# Patient Record
Sex: Male | Born: 1935 | Race: White | Hispanic: No | Marital: Married | Smoking: Never smoker
Health system: Southern US, Community
[De-identification: ages and names within clinical notes are randomized; demographics above are authoritative.]

## PROBLEM LIST (undated history)

## (undated) DIAGNOSIS — K5730 Diverticulosis of large intestine without perforation or abscess without bleeding: Secondary | ICD-10-CM

## (undated) DIAGNOSIS — G709 Myoneural disorder, unspecified: Secondary | ICD-10-CM

## (undated) DIAGNOSIS — E785 Hyperlipidemia, unspecified: Secondary | ICD-10-CM

## (undated) DIAGNOSIS — I712 Thoracic aortic aneurysm, without rupture, unspecified: Secondary | ICD-10-CM

## (undated) DIAGNOSIS — E538 Deficiency of other specified B group vitamins: Secondary | ICD-10-CM

## (undated) DIAGNOSIS — I10 Essential (primary) hypertension: Secondary | ICD-10-CM

## (undated) DIAGNOSIS — E119 Type 2 diabetes mellitus without complications: Secondary | ICD-10-CM

## (undated) DIAGNOSIS — Z9289 Personal history of other medical treatment: Secondary | ICD-10-CM

## (undated) DIAGNOSIS — M545 Other chronic pain: Secondary | ICD-10-CM

## (undated) DIAGNOSIS — C61 Malignant neoplasm of prostate: Secondary | ICD-10-CM

## (undated) DIAGNOSIS — Z9221 Personal history of antineoplastic chemotherapy: Secondary | ICD-10-CM

## (undated) DIAGNOSIS — M109 Gout, unspecified: Secondary | ICD-10-CM

## (undated) DIAGNOSIS — G8929 Other chronic pain: Secondary | ICD-10-CM

## (undated) DIAGNOSIS — Z923 Personal history of irradiation: Secondary | ICD-10-CM

## (undated) DIAGNOSIS — I2510 Atherosclerotic heart disease of native coronary artery without angina pectoris: Secondary | ICD-10-CM

## (undated) DIAGNOSIS — K219 Gastro-esophageal reflux disease without esophagitis: Secondary | ICD-10-CM

## (undated) DIAGNOSIS — G629 Polyneuropathy, unspecified: Secondary | ICD-10-CM

## (undated) HISTORY — DX: Diverticulosis of large intestine without perforation or abscess without bleeding: K57.30

## (undated) HISTORY — DX: Gout, unspecified: M10.9

## (undated) HISTORY — PX: APPENDECTOMY: SHX54

## (undated) HISTORY — PX: PROSTATE SURGERY: SHX751

## (undated) HISTORY — DX: Personal history of irradiation: Z92.3

## (undated) HISTORY — DX: Myoneural disorder, unspecified: G70.9

## (undated) HISTORY — PX: OTHER SURGICAL HISTORY: SHX169

## (undated) HISTORY — DX: Other chronic pain: G89.29

## (undated) HISTORY — DX: Personal history of antineoplastic chemotherapy: Z92.21

## (undated) HISTORY — DX: Atherosclerotic heart disease of native coronary artery without angina pectoris: I25.10

## (undated) HISTORY — DX: Type 2 diabetes mellitus without complications: E11.9

## (undated) HISTORY — PX: HERNIA REPAIR: SHX51

## (undated) HISTORY — PX: CORONARY ANGIOPLASTY WITH STENT PLACEMENT: SHX49

## (undated) HISTORY — PX: SPINE SURGERY: SHX786

## (undated) HISTORY — DX: Essential (primary) hypertension: I10

## (undated) HISTORY — DX: Thoracic aortic aneurysm, without rupture, unspecified: I71.20

## (undated) HISTORY — DX: Other chronic pain: M54.50

## (undated) HISTORY — DX: Deficiency of other specified B group vitamins: E53.8

## (undated) HISTORY — DX: Malignant neoplasm of prostate: C61

## (undated) HISTORY — DX: Thoracic aortic aneurysm, without rupture: I71.2

---
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
## 2002-09-29 HISTORY — PX: FOOT SURGERY: SHX648

## 2005-09-29 HISTORY — PX: OTHER SURGICAL HISTORY: SHX169

## 2008-11-27 HISTORY — PX: PROSTATE SURGERY: SHX751

## 2009-11-01 DIAGNOSIS — Z9289 Personal history of other medical treatment: Secondary | ICD-10-CM

## 2009-11-01 HISTORY — DX: Personal history of other medical treatment: Z92.89

## 2012-02-13 ENCOUNTER — Emergency Department (HOSPITAL_BASED_OUTPATIENT_CLINIC_OR_DEPARTMENT_OTHER): Payer: Medicare Other

## 2012-02-13 ENCOUNTER — Emergency Department (HOSPITAL_BASED_OUTPATIENT_CLINIC_OR_DEPARTMENT_OTHER)
Admission: EM | Admit: 2012-02-13 | Discharge: 2012-02-14 | Disposition: A | Discharge status: Home / Self Care | Payer: Medicare Other | Attending: Emergency Medicine | Admitting: Emergency Medicine

## 2012-02-13 ENCOUNTER — Encounter (HOSPITAL_BASED_OUTPATIENT_CLINIC_OR_DEPARTMENT_OTHER): Payer: Self-pay

## 2012-02-13 DIAGNOSIS — R509 Fever, unspecified: Secondary | ICD-10-CM | POA: Insufficient documentation

## 2012-02-13 DIAGNOSIS — E785 Hyperlipidemia, unspecified: Secondary | ICD-10-CM | POA: Insufficient documentation

## 2012-02-13 DIAGNOSIS — K219 Gastro-esophageal reflux disease without esophagitis: Secondary | ICD-10-CM | POA: Insufficient documentation

## 2012-02-13 DIAGNOSIS — Z9889 Other specified postprocedural states: Secondary | ICD-10-CM | POA: Insufficient documentation

## 2012-02-13 DIAGNOSIS — A419 Sepsis, unspecified organism: Secondary | ICD-10-CM

## 2012-02-13 DIAGNOSIS — R300 Dysuria: Secondary | ICD-10-CM | POA: Insufficient documentation

## 2012-02-13 DIAGNOSIS — R000 Tachycardia, unspecified: Secondary | ICD-10-CM | POA: Insufficient documentation

## 2012-02-13 DIAGNOSIS — Z8639 Personal history of other endocrine, nutritional and metabolic disease: Secondary | ICD-10-CM | POA: Insufficient documentation

## 2012-02-13 DIAGNOSIS — Z862 Personal history of diseases of the blood and blood-forming organs and certain disorders involving the immune mechanism: Secondary | ICD-10-CM | POA: Insufficient documentation

## 2012-02-13 DIAGNOSIS — N390 Urinary tract infection, site not specified: Secondary | ICD-10-CM | POA: Insufficient documentation

## 2012-02-13 DIAGNOSIS — Z79899 Other long term (current) drug therapy: Secondary | ICD-10-CM | POA: Insufficient documentation

## 2012-02-13 DIAGNOSIS — R109 Unspecified abdominal pain: Secondary | ICD-10-CM | POA: Insufficient documentation

## 2012-02-13 HISTORY — DX: Gastro-esophageal reflux disease without esophagitis: K21.9

## 2012-02-13 HISTORY — DX: Polyneuropathy, unspecified: G62.9

## 2012-02-13 HISTORY — DX: Hyperlipidemia, unspecified: E78.5

## 2012-02-13 LAB — DIFFERENTIAL
Basophils Relative: 0 % (ref 0–1)
Eosinophils Relative: 4 % (ref 0–5)
Lymphocytes Relative: 4 % — ABNORMAL LOW (ref 12–46)
Monocytes Relative: 8 % (ref 3–12)
Neutro Abs: 8.8 10*3/uL — ABNORMAL HIGH (ref 1.7–7.7)
Neutrophils Relative %: 84 % — ABNORMAL HIGH (ref 43–77)

## 2012-02-13 LAB — COMPREHENSIVE METABOLIC PANEL
Alkaline Phosphatase: 139 U/L — ABNORMAL HIGH (ref 39–117)
BUN: 17 mg/dL (ref 6–23)
CO2: 23 mEq/L (ref 19–32)
Chloride: 100 mEq/L (ref 96–112)
Creatinine, Ser: 1.5 mg/dL — ABNORMAL HIGH (ref 0.50–1.35)
GFR calc non Af Amer: 44 mL/min — ABNORMAL LOW (ref >90)
Glucose, Bld: 204 mg/dL — ABNORMAL HIGH (ref 70–99)
Potassium: 4.4 mEq/L (ref 3.5–5.1)
Total Bilirubin: 0.6 mg/dL (ref 0.3–1.2)

## 2012-02-13 LAB — CBC
HCT: 40.1 % (ref 39.0–52.0)
Hemoglobin: 14.4 g/dL (ref 13.0–17.0)
MCHC: 35.9 g/dL (ref 30.0–36.0)
RBC: 4.61 MIL/uL (ref 4.22–5.81)
WBC: 10.4 10*3/uL (ref 4.0–10.5)

## 2012-02-13 LAB — URINE MICROSCOPIC-ADD ON

## 2012-02-13 LAB — URINALYSIS, ROUTINE W REFLEX MICROSCOPIC
Glucose, UA: NEGATIVE mg/dL
Ketones, ur: 15 mg/dL — AB
Specific Gravity, Urine: 1.025 (ref 1.005–1.030)
pH: 5.5 (ref 5.0–8.0)

## 2012-02-13 LAB — LACTIC ACID, PLASMA: Lactic Acid, Venous: 2.7 mmol/L — ABNORMAL HIGH (ref 0.5–2.2)

## 2012-02-13 MED ORDER — PIPERACILLIN-TAZOBACTAM 3.375 G IVPB
3.3750 g | Freq: Once | INTRAVENOUS | Status: AC
  Administered 2012-02-13: 3.375 g via INTRAVENOUS
  Filled 2012-02-13: qty 50

## 2012-02-13 MED ORDER — VANCOMYCIN HCL IN DEXTROSE 1-5 GM/200ML-% IV SOLN
1000.0000 mg | Freq: Once | INTRAVENOUS | Status: AC
  Administered 2012-02-13: 1000 mg via INTRAVENOUS
  Filled 2012-02-13: qty 200

## 2012-02-13 MED ORDER — ACETAMINOPHEN 325 MG PO TABS
650.0000 mg | ORAL_TABLET | Freq: Once | ORAL | Status: AC
  Administered 2012-02-13: 650 mg via ORAL
  Filled 2012-02-13: qty 2

## 2012-02-13 MED ORDER — SODIUM CHLORIDE 0.9 % IV SOLN
Freq: Once | INTRAVENOUS | Status: AC
  Administered 2012-02-13: 18:00:00 via INTRAVENOUS

## 2012-02-13 NOTE — ED Notes (Signed)
PA at bedside.

## 2012-02-13 NOTE — ED Notes (Signed)
Pt given urinal and asked for specimen.

## 2012-02-13 NOTE — ED Provider Notes (Signed)
History     CSN: [id number]  Arrival date & time 02/13/12  1608   First MD Initiated Contact with Patient 02/13/12 1706      Chief Complaint  Patient presents with  . Urinary Tract Infection    (Consider location/radiation/quality/duration/timing/severity/associated sxs/prior treatment) Patient is a 78 y.o. male presenting with fever and abdominal pain. The history is provided by the patient. No language interpreter was used.  Fever Primary symptoms of the febrile illness include fever, abdominal pain and dysuria. The current episode started today. This is a new problem. The problem has been gradually worsening.  The dysuria began today. The discomfort is mild. The dysuria is associated with hematuria.  The onset of the illness is associated with recent antibiotic use (recent uti). Risk factors: prostate. Abdominal Pain The primary symptoms of the illness include abdominal pain, fever and dysuria.  The dysuria is associated with hematuria.  Associated with: decreased appetite. Additional symptoms associated with the illness include hematuria.  Pt complains of feeling bad today.   Pt diagnosed with a uti last Sunday and started on macrobid.   Pt complains of a fever, pain.  Feeling weak.   Pt took ultram and it made him feel worse.    Past Medical History  Diagnosis Date  . Neuropathy   . Gout   . GERD (gastroesophageal reflux disease)   . Hyperlipemia     Past Surgical History  Procedure Date  . Prostate surgery   . Coronary angioplasty with stent placement     No family history on file.  History  Substance Use Topics  . Smoking status: Never Smoker   . Smokeless tobacco: Not on file  . Alcohol Use: No      Review of Systems  Constitutional: Positive for fever.  Gastrointestinal: Positive for abdominal pain.  Genitourinary: Positive for dysuria and hematuria.  All other systems reviewed and are negative.    Allergies  Review of patient's allergies indicates  no known allergies.  Home Medications   Current Outpatient Rx  Name Route Sig Dispense Refill  . ALLOPURINOL 300 MG PO TABS Oral Take 150 mg by mouth daily.    . OMEGA-3 FATTY ACIDS 1000 MG PO CAPS Oral Take 2 g by mouth daily.    Marland Kitchen GABAPENTIN 600 MG PO TABS Oral Take 1,200 mg by mouth 3 (three) times daily.    . ACIDOPHILUS PO Oral Take 175 mg by mouth 2 (two) times daily. Takes with antibiotic    . NITROFURANTOIN MONOHYD MACRO 100 MG PO CAPS Oral Take 100 mg by mouth 2 (two) times daily.    Marland Kitchen OMEPRAZOLE 20 MG PO CPDR Oral Take 20 mg by mouth daily.    Marland Kitchen PRAVASTATIN SODIUM 40 MG PO TABS Oral Take 40 mg by mouth daily.    . TRAMADOL HCL 50 MG PO TABS Oral Take 50 mg by mouth every 6 (six) hours as needed. For pain      BP 135/60  Pulse 122  Temp(Src) 98.6 F (37 C) (Oral)  Resp 20  Ht 6\' 1"  (1.854 m)  Wt 210 lb (95.255 kg)  BMI 27.71 kg/m2  SpO2 97%  Physical Exam  Nursing note and vitals reviewed. Constitutional: He is oriented to person, place, and time. He appears well-developed and well-nourished.       Temp by me 103  HENT:  Head: Normocephalic and atraumatic.  Nose: Nose normal.  Mouth/Throat: Oropharynx is clear and moist.  Neck: Normal range of motion. Neck  supple.  Cardiovascular: Normal heart sounds.        tachycardia  Pulmonary/Chest: Effort normal and breath sounds normal.  Abdominal: Soft. Bowel sounds are normal.  Musculoskeletal: Normal range of motion.  Neurological: He is alert and oriented to person, place, and time. He has normal reflexes.  Skin: Skin is warm.  Psychiatric: He has a normal mood and affect.    ED Course  Procedures (including critical care time)   Labs Reviewed  URINALYSIS, ROUTINE W REFLEX MICROSCOPIC   No results found.   No diagnosis found.    MDM   Results for orders placed during the hospital encounter of 02/13/12  URINALYSIS, ROUTINE W REFLEX MICROSCOPIC      Component Value Range   Color, Urine AMBER (*) YELLOW     APPearance CLOUDY (*) CLEAR    Specific Gravity, Urine 1.025  1.005 - 1.030    pH 5.5  5.0 - 8.0    Glucose, UA NEGATIVE  NEGATIVE (mg/dL)   Hgb urine dipstick NEGATIVE  NEGATIVE    Bilirubin Urine SMALL (*) NEGATIVE    Ketones, ur 15 (*) NEGATIVE (mg/dL)   Protein, ur 161 (*) NEGATIVE (mg/dL)   Urobilinogen, UA 1.0  0.0 - 1.0 (mg/dL)   Nitrite NEGATIVE  NEGATIVE    Leukocytes, UA SMALL (*) NEGATIVE   CBC      Component Value Range   WBC 10.4  4.0 - 10.5 (K/uL)   RBC 4.61  4.22 - 5.81 (MIL/uL)   Hemoglobin 14.4  13.0 - 17.0 (g/dL)   HCT 09.6  04.5 - 40.9 (%)   MCV 87.0  78.0 - 100.0 (fL)   MCH 31.2  26.0 - 34.0 (pg)   MCHC 35.9  30.0 - 36.0 (g/dL)   RDW 81.1  91.4 - 78.2 (%)   Platelets 224  150 - 400 (K/uL)  DIFFERENTIAL      Component Value Range   Neutrophils Relative 84 (*) 43 - 77 (%)   Lymphocytes Relative 4 (*) 12 - 46 (%)   Monocytes Relative 8  3 - 12 (%)   Eosinophils Relative 4  0 - 5 (%)   Basophils Relative 0  0 - 1 (%)   Neutro Abs 8.8 (*) 1.7 - 7.7 (K/uL)   Lymphs Abs 0.4 (*) 0.7 - 4.0 (K/uL)   Monocytes Absolute 0.8  0.1 - 1.0 (K/uL)   Eosinophils Absolute 0.4  0.0 - 0.7 (K/uL)   Basophils Absolute 0.0  0.0 - 0.1 (K/uL)  COMPREHENSIVE METABOLIC PANEL      Component Value Range   Sodium 135  135 - 145 (mEq/L)   Potassium 4.4  3.5 - 5.1 (mEq/L)   Chloride 100  96 - 112 (mEq/L)   CO2 23  19 - 32 (mEq/L)   Glucose, Bld 204 (*) 70 - 99 (mg/dL)   BUN 17  6 - 23 (mg/dL)   Creatinine, Ser 9.56 (*) 0.50 - 1.35 (mg/dL)   Calcium 9.4  8.4 - 21.3 (mg/dL)   Total Protein 6.5  6.0 - 8.3 (g/dL)   Albumin 3.3 (*) 3.5 - 5.2 (g/dL)   AST 73 (*) 0 - 37 (U/L)   ALT 115 (*) 0 - 53 (U/L)   Alkaline Phosphatase 139 (*) 39 - 117 (U/L)   Total Bilirubin 0.6  0.3 - 1.2 (mg/dL)   GFR calc non Af Amer 44 (*) >90 (mL/min)   GFR calc Af Amer 51 (*) >90 (mL/min)  LACTIC ACID, PLASMA  Component Value Range   Lactic Acid, Venous 2.7 (*) 0.5 - 2.2 (mmol/L)  URINE  MICROSCOPIC-ADD ON      Component Value Range   Squamous Epithelial / LPF RARE  RARE    WBC, UA 7-10  <3 (WBC/hpf)   Bacteria, UA RARE  RARE    Dg Chest 2 View  02/13/2012  *RADIOLOGY REPORT*  Clinical Data: Fever.  The bladder infection.  Weakness.  CHEST - 2 VIEW  Comparison: No priors.  Findings: Lung volumes are normal.  No consolidative airspace disease.  Mild pulmonary venous congestion without frank pulmonary edema.  No pleural effusions.  Heart size is mildly enlarged with evidence of left atrial dilatation. The patient is rotated to the right on today's exam, resulting in distortion of the mediastinal contours and reduced diagnostic sensitivity and specificity for mediastinal pathology. Old healed fracture of the right mid clavicle incidentally noted.  IMPRESSION: 1.  Mild pulmonary venous congestion without frank pulmonary edema. 2.  Mild cardiomegaly with evidence to suggest left atrial dilatation.  Original Report Authenticated By: Florencia Reasons, M.D.      Pt given IV fluids x 1 liter,  Zosyn and vancomycin.   Pt given tylenol po.   Pt has had cdiff in the past from antibiotics.  I spoke to the [facility] [place] [facility] hospitalist  Dr. Selena Batten who will admit pt.       Lonia Skinner [place], [place] 02/13/12 2141  Lonia Skinner [place], [place] 02/13/12 2142  Lonia Skinner [place], [place] 02/13/12 2142  Lonia Skinner [place], [place] 02/13/12 2144

## 2012-02-13 NOTE — ED Notes (Signed)
Pt c/o bladder infection.  Pt states he was DX on Sunday, started Macrobid, SX not improving.

## 2012-02-13 NOTE — ED Provider Notes (Signed)
History/physical exam/procedure(s) were performed by non-physician practitioner and as supervising physician I was immediately available for consultation/collaboration. I have reviewed all notes and am in agreement with care and plan.   Zhoe Catania S Zanden Colver, MD 02/13/12 2319 

## 2012-02-14 NOTE — ED Notes (Signed)
System downtime during carelink arrival.

## 2014-04-10 DIAGNOSIS — M1710 Unilateral primary osteoarthritis, unspecified knee: Secondary | ICD-10-CM | POA: Insufficient documentation

## 2014-04-10 DIAGNOSIS — Z96651 Presence of right artificial knee joint: Secondary | ICD-10-CM | POA: Insufficient documentation

## 2014-08-15 DIAGNOSIS — M7740 Metatarsalgia, unspecified foot: Secondary | ICD-10-CM | POA: Insufficient documentation

## 2014-08-15 DIAGNOSIS — B999 Unspecified infectious disease: Secondary | ICD-10-CM | POA: Insufficient documentation

## 2014-08-15 DIAGNOSIS — M2040 Other hammer toe(s) (acquired), unspecified foot: Secondary | ICD-10-CM | POA: Insufficient documentation

## 2014-08-15 DIAGNOSIS — M779 Enthesopathy, unspecified: Secondary | ICD-10-CM | POA: Insufficient documentation

## 2014-08-15 DIAGNOSIS — M51369 Other intervertebral disc degeneration, lumbar region without mention of lumbar back pain or lower extremity pain: Secondary | ICD-10-CM | POA: Insufficient documentation

## 2014-08-15 DIAGNOSIS — R972 Elevated prostate specific antigen [PSA]: Secondary | ICD-10-CM | POA: Insufficient documentation

## 2014-08-15 DIAGNOSIS — H905 Unspecified sensorineural hearing loss: Secondary | ICD-10-CM | POA: Insufficient documentation

## 2014-08-15 DIAGNOSIS — M48061 Spinal stenosis, lumbar region without neurogenic claudication: Secondary | ICD-10-CM | POA: Insufficient documentation

## 2014-08-15 DIAGNOSIS — N401 Enlarged prostate with lower urinary tract symptoms: Secondary | ICD-10-CM | POA: Insufficient documentation

## 2014-08-15 DIAGNOSIS — M1460 Charcot's joint, unspecified site: Secondary | ICD-10-CM | POA: Insufficient documentation

## 2014-08-15 DIAGNOSIS — M79609 Pain in unspecified limb: Secondary | ICD-10-CM | POA: Insufficient documentation

## 2014-08-15 DIAGNOSIS — L0291 Cutaneous abscess, unspecified: Secondary | ICD-10-CM | POA: Insufficient documentation

## 2014-08-15 DIAGNOSIS — R109 Unspecified abdominal pain: Secondary | ICD-10-CM | POA: Insufficient documentation

## 2014-08-15 DIAGNOSIS — L03119 Cellulitis of unspecified part of limb: Secondary | ICD-10-CM | POA: Insufficient documentation

## 2014-08-15 DIAGNOSIS — H6980 Other specified disorders of Eustachian tube, unspecified ear: Secondary | ICD-10-CM | POA: Insufficient documentation

## 2014-08-15 DIAGNOSIS — L97509 Non-pressure chronic ulcer of other part of unspecified foot with unspecified severity: Secondary | ICD-10-CM | POA: Insufficient documentation

## 2014-08-15 DIAGNOSIS — R351 Nocturia: Secondary | ICD-10-CM | POA: Insufficient documentation

## 2014-08-15 DIAGNOSIS — M5136 Other intervertebral disc degeneration, lumbar region: Secondary | ICD-10-CM | POA: Insufficient documentation

## 2014-08-15 DIAGNOSIS — T849XXA Unspecified complication of internal orthopedic prosthetic device, implant and graft, initial encounter: Secondary | ICD-10-CM | POA: Insufficient documentation

## 2014-08-15 DIAGNOSIS — R300 Dysuria: Secondary | ICD-10-CM | POA: Insufficient documentation

## 2014-08-15 DIAGNOSIS — L8990 Pressure ulcer of unspecified site, unspecified stage: Secondary | ICD-10-CM | POA: Insufficient documentation

## 2014-08-15 DIAGNOSIS — R310 Gross hematuria: Secondary | ICD-10-CM | POA: Insufficient documentation

## 2014-11-08 DIAGNOSIS — M19011 Primary osteoarthritis, right shoulder: Secondary | ICD-10-CM | POA: Insufficient documentation

## 2015-04-25 HISTORY — PX: COLONOSCOPY: SHX174

## 2016-02-18 DIAGNOSIS — G600 Hereditary motor and sensory neuropathy: Secondary | ICD-10-CM | POA: Insufficient documentation

## 2016-03-17 DIAGNOSIS — N5201 Erectile dysfunction due to arterial insufficiency: Secondary | ICD-10-CM | POA: Insufficient documentation

## 2016-08-06 DIAGNOSIS — E785 Hyperlipidemia, unspecified: Secondary | ICD-10-CM | POA: Insufficient documentation

## 2017-04-07 DIAGNOSIS — R251 Tremor, unspecified: Secondary | ICD-10-CM | POA: Insufficient documentation

## 2017-06-17 DIAGNOSIS — M6701 Short Achilles tendon (acquired), right ankle: Secondary | ICD-10-CM | POA: Insufficient documentation

## 2017-06-17 DIAGNOSIS — L84 Corns and callosities: Secondary | ICD-10-CM | POA: Insufficient documentation

## 2017-08-11 DIAGNOSIS — L600 Ingrowing nail: Secondary | ICD-10-CM | POA: Insufficient documentation

## 2017-08-11 DIAGNOSIS — B351 Tinea unguium: Secondary | ICD-10-CM | POA: Insufficient documentation

## 2017-09-16 DIAGNOSIS — Z7982 Long term (current) use of aspirin: Secondary | ICD-10-CM | POA: Insufficient documentation

## 2018-07-06 DIAGNOSIS — S8001XA Contusion of right knee, initial encounter: Secondary | ICD-10-CM | POA: Insufficient documentation

## 2018-10-24 ENCOUNTER — Emergency Department (HOSPITAL_BASED_OUTPATIENT_CLINIC_OR_DEPARTMENT_OTHER)
Admission: EM | Admit: 2018-10-24 | Discharge: 2018-10-24 | Disposition: A | Discharge status: Home / Self Care | Payer: MEDICARE | Attending: Emergency Medicine | Admitting: Emergency Medicine

## 2018-10-24 ENCOUNTER — Emergency Department (HOSPITAL_BASED_OUTPATIENT_CLINIC_OR_DEPARTMENT_OTHER): Payer: MEDICARE

## 2018-10-24 ENCOUNTER — Other Ambulatory Visit: Payer: Self-pay

## 2018-10-24 ENCOUNTER — Encounter (HOSPITAL_BASED_OUTPATIENT_CLINIC_OR_DEPARTMENT_OTHER): Payer: Self-pay | Admitting: Emergency Medicine

## 2018-10-24 DIAGNOSIS — Z79899 Other long term (current) drug therapy: Secondary | ICD-10-CM | POA: Insufficient documentation

## 2018-10-24 DIAGNOSIS — W19XXXA Unspecified fall, initial encounter: Secondary | ICD-10-CM

## 2018-10-24 DIAGNOSIS — Z7982 Long term (current) use of aspirin: Secondary | ICD-10-CM | POA: Insufficient documentation

## 2018-10-24 DIAGNOSIS — M25512 Pain in left shoulder: Secondary | ICD-10-CM | POA: Diagnosis not present

## 2018-10-24 DIAGNOSIS — Z955 Presence of coronary angioplasty implant and graft: Secondary | ICD-10-CM | POA: Diagnosis not present

## 2018-10-24 DIAGNOSIS — Y929 Unspecified place or not applicable: Secondary | ICD-10-CM | POA: Diagnosis not present

## 2018-10-24 DIAGNOSIS — R0789 Other chest pain: Secondary | ICD-10-CM | POA: Insufficient documentation

## 2018-10-24 DIAGNOSIS — W01198A Fall on same level from slipping, tripping and stumbling with subsequent striking against other object, initial encounter: Secondary | ICD-10-CM | POA: Insufficient documentation

## 2018-10-24 DIAGNOSIS — Z7984 Long term (current) use of oral hypoglycemic drugs: Secondary | ICD-10-CM | POA: Diagnosis not present

## 2018-10-24 DIAGNOSIS — Y9389 Activity, other specified: Secondary | ICD-10-CM | POA: Insufficient documentation

## 2018-10-24 DIAGNOSIS — S51012A Laceration without foreign body of left elbow, initial encounter: Secondary | ICD-10-CM | POA: Diagnosis not present

## 2018-10-24 DIAGNOSIS — S59902A Unspecified injury of left elbow, initial encounter: Secondary | ICD-10-CM | POA: Diagnosis present

## 2018-10-24 DIAGNOSIS — Y998 Other external cause status: Secondary | ICD-10-CM | POA: Diagnosis not present

## 2018-10-24 MED ORDER — LIDOCAINE 5 % EX PTCH: 1.0000 | MEDICATED_PATCH | CUTANEOUS | 0 refills | Status: DC

## 2018-10-24 NOTE — Discharge Instructions (Signed)
Your imaging today was overall reassuring.  I suspect you have some chest wall pain after the fall but no evidence of rib fracture or lung injury.  Please use the patches on your left chest wall to help with discomfort so you can continue to take deep breaths.  Please use the incentive spirometer as recommended by respiratory therapy.  We did find an a sending aortic aneurysm on the CT scan, please follow-up with your PCP for further surveillance and monitoring.  Please keep the skin tear on your elbow dressed and watch it for infection.  Please follow-up with your primary doctor.  Your tetanus was up-to-date.  If any symptoms change or worsen, please return to the nearest emergency department.

## 2018-10-24 NOTE — ED Notes (Signed)
Assisted pt to car in wheelchair.

## 2018-10-24 NOTE — ED Provider Notes (Signed)
[facility] EMERGENCY DEPARTMENT Provider Note   CSN: [id number] Arrival date & time: 10/24/18  1918     History   Chief Complaint Chief Complaint  Patient presents with  . Fall    HPI Edward Pruitt is a 85 y.o. male.  The history is provided by the patient, medical records and the spouse. No language interpreter was used.  Fall  This is a new problem. The current episode started 1 to 2 hours ago. The problem occurs rarely. The problem has been resolved. Associated symptoms include chest pain. Pertinent negatives include no abdominal pain, no headaches and no shortness of breath. Nothing aggravates the symptoms. Nothing relieves the symptoms. He has tried nothing for the symptoms. The treatment provided no relief.    Past Medical History:  Diagnosis Date  . GERD (gastroesophageal reflux disease)   . Gout   . Hyperlipemia   . Neuropathy     There are no active problems to display for this patient.   Past Surgical History:  Procedure Laterality Date  . CORONARY ANGIOPLASTY WITH STENT PLACEMENT    . PROSTATE SURGERY          Home Medications    Prior to Admission medications   Medication Sig Start Date End Date Taking? Authorizing Provider  atorvastatin (LIPITOR) 40 MG tablet  09/16/16  Yes [provider]  citalopram (CELEXA) 20 MG tablet  09/02/15  Yes [provider]  clonazePAM (KLONOPIN) 0.5 MG tablet TAKE 2 TABLETS BY MOUTH NIGHTLY AS NEEDED FOR ANXIETY 06/27/15  Yes [provider]  finasteride (PROSCAR) 5 MG tablet TAKE 1 TABLET EVERY DAY 04/27/17  Yes [provider]  metFORMIN (GLUCOPHAGE) 500 MG tablet Take by mouth. 09/02/15  Yes [provider]  rosuvastatin (CRESTOR) 40 MG tablet  09/13/18  Yes [provider]  allopurinol (ZYLOPRIM) 300 MG tablet Take 150 mg by mouth daily.    [provider]  aspirin EC 81 MG tablet Take by mouth.    [provider]    Carboxymethylcellulose Sod PF (REFRESH PLUS) 0.5 % SOLN 1 drop 3 (three) times daily as needed.    [provider]  fish oil-omega-3 fatty acids 1000 MG capsule Take 2 g by mouth daily.    [provider]  gabapentin (NEURONTIN) 600 MG tablet Take 1,200 mg by mouth 3 (three) times daily.    [provider]  Lactobacillus (ACIDOPHILUS PO) Take 175 mg by mouth 2 (two) times daily. Takes with antibiotic    [provider]  nitrofurantoin, macrocrystal-monohydrate, (MACROBID) 100 MG capsule Take 100 mg by mouth 2 (two) times daily.    [provider]  omeprazole (PRILOSEC) 20 MG capsule Take 20 mg by mouth daily.    [provider]  pravastatin (PRAVACHOL) 40 MG tablet Take 40 mg by mouth daily.    [provider]  traMADol (ULTRAM) 50 MG tablet Take 50 mg by mouth every 6 (six) hours as needed. For pain    [provider]    Family History History reviewed. No pertinent family history.  Social History Social History   Tobacco Use  . Smoking status: Never Smoker  . Smokeless tobacco: Never Used  Substance Use Topics  . Alcohol use: No  . Drug use: No     Allergies   Patient has no known allergies.   Review of Systems Review of Systems  Constitutional: Negative for chills, diaphoresis, fatigue and fever.  HENT: Negative for congestion and  rhinorrhea.   Eyes: Negative for photophobia and visual disturbance.  Respiratory: Negative for cough, chest tightness, shortness of breath, wheezing and stridor.   Cardiovascular: Positive for chest pain. Negative for palpitations and leg swelling.  Gastrointestinal: Negative for abdominal pain, constipation, diarrhea, nausea and vomiting.  Genitourinary: Negative for dysuria and frequency.  Musculoskeletal: Negative for back pain, neck pain and neck stiffness.  Skin: Positive for wound. Negative for rash.  Neurological: Negative for weakness, light-headedness, numbness and  headaches.  Psychiatric/Behavioral: Negative for agitation.  All other systems reviewed and are negative.    Physical Exam Updated Vital Signs BP 103/83 (BP Location: Left Arm)   Pulse 89   Temp 98.5 F (36.9 C) (Oral)   Resp 20   Ht 6\' 1"  (1.854 m)   Wt 95.3 kg   SpO2 96%   BMI 27.71 kg/m   Physical Exam Vitals signs and nursing note reviewed.  Constitutional:      General: He is not in acute distress.    Appearance: He is well-developed. He is not ill-appearing, toxic-appearing or diaphoretic.  HENT:     Head: Normocephalic and atraumatic.     Nose: No congestion or rhinorrhea.     Mouth/Throat:     Pharynx: No oropharyngeal exudate or posterior oropharyngeal erythema.  Eyes:     Conjunctiva/sclera: Conjunctivae normal.  Neck:     Musculoskeletal: Neck supple.  Cardiovascular:     Rate and Rhythm: Normal rate and regular rhythm.     Heart sounds: No murmur.  Pulmonary:     Effort: Pulmonary effort is normal. No respiratory distress.     Breath sounds: Normal breath sounds. No wheezing, rhonchi or rales.  Chest:     Chest wall: Tenderness present.  Abdominal:     General: There is no distension.     Palpations: Abdomen is soft.     Tenderness: There is no abdominal tenderness.  Musculoskeletal:        General: Tenderness present.     Left elbow: He exhibits laceration. He exhibits normal range of motion and no swelling. No tenderness found.       Arms:     Right lower leg: No edema.     Left lower leg: No edema.     Comments: Normal pulse, grip strength, sensation in left arm distal to the left elbow injury.  No tenderness.  Skin:    General: Skin is warm and dry.     Capillary Refill: Capillary refill takes less than 2 seconds.     Coloration: Skin is not jaundiced.     Findings: No erythema, lesion or rash.  Neurological:     General: No focal deficit present.     Mental Status: He is alert and oriented to person, place, and time.     Sensory: No  sensory deficit.     Motor: No weakness.     Gait: Gait normal.  Psychiatric:        Mood and Affect: Mood normal.      ED Treatments / Results  Labs (all labs ordered are listed, but only abnormal results are displayed) Labs Reviewed - No data to display  EKG None  Radiology Dg Ribs Unilateral W/chest Left  Result Date: 10/24/2018 CLINICAL DATA:  Two falls today. Falling against fireplace striking shoulder and left ribs. Left rib and shoulder pain. EXAM: LEFT RIBS AND CHEST - 3+ VIEW COMPARISON:  None. FINDINGS: Cortical margins of the left ribs are intact without evidence of  acute fracture. No focal rib abnormality. Mild cardiomegaly with normal mediastinal contours. No pneumothorax, focal airspace disease, or pleural fluid. No pulmonary edema. IMPRESSION: 1. Intact left ribs without acute fracture. 2. Mild cardiomegaly. Electronically Signed   By: Keith Rake M.D.   On: 10/24/2018 21:17   Ct Chest Wo Contrast  Result Date: 10/24/2018 CLINICAL DATA:  Left rib pain after fall against fireplace. Rib fx suspected, post trauma fall,concern for occult L laterl rib fractures EXAM: CT CHEST WITHOUT CONTRAST TECHNIQUE: Multidetector CT imaging of the chest was performed following the standard protocol without IV contrast. COMPARISON:  Chest and left rib radiographs earlier this day. FINDINGS: Cardiovascular: Elongated thoracic aorta with atherosclerosis. Aneurysmal dilatation of the ascending aorta at 4.9 cm. No periaortic stranding. Mild cardiomegaly. Coronary artery calcifications and coronary stent. No pericardial effusion. Mediastinum/Nodes: No mediastinal hemorrhage. No pneumomediastinum. No mediastinal adenopathy. No evidence of bulky hilar adenopathy. Lack of contrast limits detailed hilar assessment. The esophagus is decompressed, small hiatal hernia. Diminutive thyroid gland. Lungs/Pleura: No pneumothorax. No pulmonary contusion. No focal airspace disease. No pulmonary edema. No  pulmonary mass. Upper Abdomen: No acute findings. No free fluid. Musculoskeletal: No acute rib fracture, with particular attention to the left ribs. Remote fracture with callus formation about right lateral eighth rib. Sternum is intact. Degenerative change in the spine without acute fracture. Degenerative change of both shoulders without acute fracture. No confluent chest wall contusion. IMPRESSION: 1. No acute traumatic injury to the thorax. Particularly, no acute left rib fracture. 2. Aneurysmal dilatation of the ascending aorta at 4.9 cm. Recommend semi-annual imaging followup by CTA or MRA and referral to cardiothoracic surgery if not already obtained. This recommendation follows 2010 ACCF/AHA/AATS/ACR/ASA/SCA/SCAI/SIR/STS/SVM Guidelines for the Diagnosis and Management of Patients With Thoracic Aortic Disease. 2010; 121: J856-D149. Aortic aneurysm NOS (ICD10-I71.9). Aortic Atherosclerosis (ICD10-I70.0). Electronically Signed   By: Keith Rake M.D.   On: 10/24/2018 22:00   Dg Shoulder Left  Result Date: 10/24/2018 CLINICAL DATA:  Two falls today. Falling against fireplace striking shoulder and left ribs. Left rib and shoulder pain. EXAM: LEFT SHOULDER - 2+ VIEW COMPARISON:  None. FINDINGS: There is no evidence of fracture or dislocation. 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 Soft tissues are unremarkable. IMPRESSION: 1. No acute fracture or subluxation. 2. 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 Electronically Signed   By: Keith Rake M.D.   On: 10/24/2018 21:18    Procedures Procedures (including critical care time)  Medications Ordered in ED Medications - No data to display   Initial Impression / Assessment and Plan / ED Course  I have reviewed the triage vital signs and the nursing notes.  Pertinent labs & imaging results that were available during my care of the patient were reviewed by me and considered in my medical decision  making (see chart for details).     Edward DELAUGHTER is a 85 y.o. male with a past medical history significant for CAD with PCI, gout, GERD, and hyperlipidemia who presents with a fall.  He reports that he had a mechanical fall and fell into a fireplace/hearth this evening.  He reports hitting his head but denies loss of consciousness, nausea, vomiting, vision changes or headache.  He denies any discomfort in his head.  He is primarily complaining of pain in his left shoulder, skin tear on his left elbow, and pain in his left lateral chest.  He denies shortness of breath.  He denies any abdominal pain back pain or lower extremity pains.  He has no pain in his arms.  He is a small skin tear on his left elbow.  On exam, patient has a small skin tear on his left elbow that is hemostatic on arrival.  Normal grip strength, sensation, and pulses in the upper extremities.  Mild tenderness in the left shoulder.  Left chest wall tenderness.  Lungs were clear.  No focal neurologic deficits and patient otherwise resting comfortably.  Normal gait.  Patient had x-ray of the left chest wall and left shoulder.  X-rays were reassuring however given continued chest discomfort on reexamination, CT was ordered to rule out occult rib fracture or lung injury.  CT scan was reassuring aside from a incidental finding of aortic aneurysm.  He will follow-up with PCP for this.  Patient given incentive spirometer and patient did not want oral pain medicine but would agree to a Lidoderm patch.  This was ordered.  Patient will follow-up with PCP as well as instructed on pulmonary toilet and breathing techniques.  Patient will follow with PCP and understood return precautions.  Patient discharged in good condition.       Final Clinical Impressions(s) / ED Diagnoses   Final diagnoses:  Fall, initial encounter  Skin tear of left elbow without complication, initial encounter  Left-sided chest wall pain    ED Discharge Orders           Ordered    lidocaine (LIDODERM) 5 %  Every 24 hours     10/24/18 2321         Clinical Impression: 1. Fall, initial encounter   2. Skin tear of left elbow without complication, initial encounter   3. Left-sided chest wall pain     Disposition: Discharge  Condition: Good  I have discussed the results, Dx and Tx plan with the pt(& family if present). He/she/they expressed understanding and agree(s) with the plan. Discharge instructions discussed at great length. Strict return precautions discussed and pt &/or family have verbalized understanding of the instructions. No further questions at time of discharge.    Discharge Medication List as of 10/24/2018 11:23 PM    START taking these medications   Details  lidocaine (LIDODERM) 5 % Place 1 patch onto the skin daily. Remove & Discard patch within 12 hours or as directed by MD, Starting Sun 10/24/2018, Print        Follow Up: Charleston Poot, MD [address] [place] [address] [facility] [address] [id number] YN [facility] [place] [id number]       Tegeler, Gwenyth Allegra, MD 10/25/18 [phone]

## 2018-10-24 NOTE — ED Notes (Signed)
Patient transported to CT 

## 2018-10-24 NOTE — ED Triage Notes (Signed)
Patient states that he fell up against and fireplace about an hour ago - the patient states that he has pain to his left shoulder and his left forehead  Denies any LOC or blood thinners

## 2018-11-05 DIAGNOSIS — G2581 Restless legs syndrome: Secondary | ICD-10-CM | POA: Insufficient documentation

## 2018-12-14 DIAGNOSIS — S46911A Strain of unspecified muscle, fascia and tendon at shoulder and upper arm level, right arm, initial encounter: Secondary | ICD-10-CM | POA: Insufficient documentation

## 2019-02-24 ENCOUNTER — Other Ambulatory Visit: Payer: Self-pay

## 2019-02-24 ENCOUNTER — Encounter (HOSPITAL_BASED_OUTPATIENT_CLINIC_OR_DEPARTMENT_OTHER): Payer: Self-pay | Admitting: *Deleted

## 2019-02-24 ENCOUNTER — Emergency Department (HOSPITAL_BASED_OUTPATIENT_CLINIC_OR_DEPARTMENT_OTHER)
Admission: EM | Admit: 2019-02-24 | Discharge: 2019-02-24 | Disposition: A | Discharge status: Home / Self Care | Payer: MEDICARE | Attending: Emergency Medicine | Admitting: Emergency Medicine

## 2019-02-24 DIAGNOSIS — Z79899 Other long term (current) drug therapy: Secondary | ICD-10-CM | POA: Insufficient documentation

## 2019-02-24 DIAGNOSIS — E119 Type 2 diabetes mellitus without complications: Secondary | ICD-10-CM | POA: Insufficient documentation

## 2019-02-24 DIAGNOSIS — Z4802 Encounter for removal of sutures: Secondary | ICD-10-CM | POA: Insufficient documentation

## 2019-02-24 DIAGNOSIS — Z4800 Encounter for change or removal of nonsurgical wound dressing: Secondary | ICD-10-CM | POA: Insufficient documentation

## 2019-02-24 DIAGNOSIS — Z5189 Encounter for other specified aftercare: Secondary | ICD-10-CM

## 2019-02-24 NOTE — ED Provider Notes (Signed)
[facility] EMERGENCY DEPARTMENT Provider Note   CSN: [id number] Arrival date & time: 02/24/19  1155    History   Chief Complaint Chief Complaint  Patient presents with  . Wound Check    HPI Edward Pruitt is a 85 y.o. male.     The history is provided by the patient and medical records. No language interpreter was used.  Wound Check    Edward Pruitt is a 85 y.o. male  with a PMH as listed below who presents to the Emergency Department complaining of worsening swelling and redness to the right hand. On May 22nd, he was outside when he lost his balance and grabbed onto a tree branch to steady himself. He did not fall, but cut the palm of his right hand on the branch. He was seen that day where wound was sutured and he was started on Augmentin and another antibiotic (he believes this was doxycyline, but not completely sure). Yesterday, he felt as if the wound was becoming more swollen and red, therefore saw his doctor. He was told to continue taking Augmentin and prescription for bactrim was added to his regimen. He woke up this morning and feels as if the redness and swelling is still getting worse, therefore wanted someone to check it for him. Has had some clear drainage from one side of the wound. No fever, chills. No weakness. Baseline neuropathy numbness, but no acute changes.   Past Medical History:  Diagnosis Date  . GERD (gastroesophageal reflux disease)   . Gout   . Hyperlipemia   . Neuropathy     There are no active problems to display for this patient.   Past Surgical History:  Procedure Laterality Date  . CORONARY ANGIOPLASTY WITH STENT PLACEMENT    . PROSTATE SURGERY          Home Medications    Prior to Admission medications   Medication Sig Start Date End Date Taking? Authorizing Provider  allopurinol (ZYLOPRIM) 300 MG tablet Take 150 mg by mouth daily.    [provider]  aspirin EC 81 MG tablet Take by mouth.    [provider]  atorvastatin (LIPITOR) 40 MG tablet  09/16/16   [provider]  Carboxymethylcellulose Sod PF (REFRESH PLUS) 0.5 % SOLN 1 drop 3 (three) times daily as needed.    [provider]  citalopram (CELEXA) 20 MG tablet  09/02/15   [provider]  clonazePAM (KLONOPIN) 0.5 MG tablet TAKE 2 TABLETS BY MOUTH NIGHTLY AS NEEDED FOR ANXIETY 06/27/15   [provider]  finasteride (PROSCAR) 5 MG tablet TAKE 1 TABLET EVERY DAY 04/27/17   [provider]  fish oil-omega-3 fatty acids 1000 MG capsule Take 2 g by mouth daily.    [provider]  gabapentin (NEURONTIN) 600 MG tablet Take 1,200 mg by mouth 3 (three) times daily.    [provider]  Lactobacillus (ACIDOPHILUS PO) Take 175 mg by mouth 2 (two) times daily. Takes with antibiotic    [provider]  lidocaine (LIDODERM) 5 % Place 1 patch onto the skin daily. Remove & Discard patch within 12 hours or as directed by MD 10/24/18   Tegeler, Gwenyth Allegra, MD  metFORMIN (GLUCOPHAGE) 500 MG tablet Take by mouth. 09/02/15   [provider]  nitrofurantoin, macrocrystal-monohydrate, (MACROBID) 100 MG capsule Take 100 mg by mouth 2 (two) times daily.    [provider]  omeprazole (PRILOSEC) 20 MG capsule Take 20 mg  by mouth daily.    [provider]  pravastatin (PRAVACHOL) 40 MG tablet Take 40 mg by mouth daily.    [provider]  rosuvastatin (CRESTOR) 40 MG tablet  09/13/18   [provider]  traMADol (ULTRAM) 50 MG tablet Take 50 mg by mouth every 6 (six) hours as needed. For pain    [provider]    Family History No family history on file.  Social History Social History   Tobacco Use  . Smoking status: Never Smoker  . Smokeless tobacco: Never Used  Substance Use Topics  . Alcohol use: No  . Drug use: No     Allergies   Patient has no known allergies.   Review of Systems Review of Systems   Constitutional: Negative for fever.  Musculoskeletal: Positive for myalgias.  Skin: Positive for color change and wound.  Neurological: Negative for weakness.     Physical Exam Updated Vital Signs BP 121/74 (BP Location: Right Arm)   Temp 98 F (36.7 C) (Oral)   Resp 18   Ht 6\' 1"  (1.854 m)   Wt 94.9 kg   SpO2 99%   BMI 27.60 kg/m   Physical Exam Vitals signs and nursing note reviewed.  Constitutional:      General: He is not in acute distress.    Appearance: He is well-developed.  HENT:     Head: Normocephalic and atraumatic.  Neck:     Musculoskeletal: Neck supple.  Cardiovascular:     Rate and Rhythm: Normal rate and regular rhythm.     Heart sounds: Normal heart sounds. No murmur.  Pulmonary:     Effort: Pulmonary effort is normal. No respiratory distress.     Breath sounds: Normal breath sounds. No wheezing or rales.  Musculoskeletal: Normal range of motion.     Comments: Right hand with full range of motion and good grip strength.  Good cap refill.  2+ radial pulse.  Skin:    General: Skin is warm and dry.     Comments: See image below of the right palm.  Serous drainage.  No purulent drainage expressed.  No tenderness, but is swollen and erythematous.  Neurological:     Mental Status: He is alert.        ED Treatments / Results  Labs (all labs ordered are listed, but only abnormal results are displayed) Labs Reviewed - No data to display  EKG None  Radiology No results found.  Procedures .Suture Removal Date/Time: 02/24/2019 1:38 PM Performed by: Ward, Ozella Almond, PA-C Authorized by: Ward, Ozella Almond, PA-C   Consent:    Consent obtained:  Verbal   Consent given by:  Patient   Risks discussed:  Wound separation and bleeding   Alternatives discussed:  No treatment Location:    Location:  Upper extremity   Upper extremity location:  Hand   Hand location:  R hand Procedure details:    Wound appearance:  Draining, tender and red    Number of sutures removed:  2 Post-procedure details:    Patient tolerance of procedure:  Tolerated well, no immediate complications   (including critical care time)  Medications Ordered in ED Medications - No data to display   Initial Impression / Assessment and Plan / ED Course  I have reviewed the triage vital signs and the nursing notes.  Pertinent labs & imaging results that were available during my care of the patient were reviewed by me and considered in my medical decision making (  see chart for details).       Edward Pruitt is a 85 y.o. male who presents to ED for recheck of right hand wound.  Laceration to the right palm about a week ago which was sutured.  He was started on Augmentin.  Seen yesterday due to worsening redness and swelling where Bactrim was added to antibiotic course.  This morning, he did feel as if it was getting a little bit worse.  On exam, neurovascularly intact.  Afebrile.  Has some serous drainage, but no purulent drainage.  2 sutures were removed to allow wound to drain a little better.  Recommend we continue double coverage antibiotics as well as topical antibiotic ointment.  Encouraged twice daily half water half peroxide soaks.  PCP follow-up if not improving.  Reasons to return to the ER were discussed and all questions answered.  Patient seen by and discussed with Dr. Johnney Killian who agrees with treatment plan.   Final Clinical Impressions(s) / ED Diagnoses   Final diagnoses:  Visit for wound check    ED Discharge Orders    None       Ward, Ozella Almond, PA-C 02/24/19 1340    Charlesetta Shanks, MD 03/11/19 1440

## 2019-02-24 NOTE — ED Notes (Signed)
ED Provider at bedside. 

## 2019-02-24 NOTE — ED Triage Notes (Signed)
Recheck wound to his right hand. He was sutures on Friday a week ago. Site is red, swollen, and painful with pus like drainage.

## 2019-02-24 NOTE — Discharge Instructions (Signed)
It was my pleasure taking care of you today!   As we discussed, soak hand in 1/2 peroxide, 1/2 water twice daily. Dry very well afterwards and apply topical antibiotic ointment.  Continue taking your antibiotics as directed until completion.  Follow-up with your doctor for wound check in a few days.  Return to ER for fevers, worsening symptoms or any additional concerns.

## 2019-02-24 NOTE — ED Provider Notes (Signed)
Medical screening examination/treatment/procedure(s) were conducted as a shared visit with non-physician practitioner(s) and myself.  I personally evaluated the patient during the encounter.  None Patient has a sutured hand laceration.  He is noting some swelling and drainage.  No significant pain.  I have reviewed the hand good range of motion some clear serous drainage around the apex of the wound.  Agree with continuing antibiotics and removing 2 sutures to facilitate drainage with ongoing local wound care.  At this time does not appear to have any spreading infection in the deep spaces of the hand.  Will need close follow-up.   Charlesetta Shanks, MD 02/24/19 1302

## 2019-04-05 ENCOUNTER — Encounter: Payer: Self-pay | Admitting: *Deleted

## 2019-04-05 DIAGNOSIS — C61 Malignant neoplasm of prostate: Secondary | ICD-10-CM

## 2019-04-05 DIAGNOSIS — M109 Gout, unspecified: Secondary | ICD-10-CM | POA: Insufficient documentation

## 2019-04-05 DIAGNOSIS — E538 Deficiency of other specified B group vitamins: Secondary | ICD-10-CM

## 2019-04-05 DIAGNOSIS — I2510 Atherosclerotic heart disease of native coronary artery without angina pectoris: Secondary | ICD-10-CM | POA: Insufficient documentation

## 2019-04-05 DIAGNOSIS — I10 Essential (primary) hypertension: Secondary | ICD-10-CM | POA: Insufficient documentation

## 2019-04-05 DIAGNOSIS — I712 Thoracic aortic aneurysm, without rupture, unspecified: Secondary | ICD-10-CM

## 2019-04-05 DIAGNOSIS — E119 Type 2 diabetes mellitus without complications: Secondary | ICD-10-CM

## 2019-04-05 DIAGNOSIS — E7800 Pure hypercholesterolemia, unspecified: Secondary | ICD-10-CM | POA: Insufficient documentation

## 2019-04-05 DIAGNOSIS — G709 Myoneural disorder, unspecified: Secondary | ICD-10-CM

## 2019-04-05 DIAGNOSIS — G8929 Other chronic pain: Secondary | ICD-10-CM | POA: Insufficient documentation

## 2019-04-05 DIAGNOSIS — K5730 Diverticulosis of large intestine without perforation or abscess without bleeding: Secondary | ICD-10-CM

## 2019-04-05 DIAGNOSIS — Z9221 Personal history of antineoplastic chemotherapy: Secondary | ICD-10-CM

## 2019-04-06 ENCOUNTER — Encounter: Payer: Self-pay | Admitting: Surgery

## 2019-04-06 ENCOUNTER — Institutional Professional Consult (permissible substitution) (INDEPENDENT_AMBULATORY_CARE_PROVIDER_SITE_OTHER): Payer: MEDICARE | Admitting: Surgery

## 2019-04-06 ENCOUNTER — Other Ambulatory Visit: Payer: Self-pay

## 2019-04-06 VITALS — BP 120/69 | HR 75 | Temp 97.6°F | Resp 20 | Ht 73.0 in | Wt 206.0 lb

## 2019-04-06 DIAGNOSIS — I712 Thoracic aortic aneurysm, without rupture: Secondary | ICD-10-CM | POA: Diagnosis not present

## 2019-04-06 DIAGNOSIS — I7121 Aneurysm of the ascending aorta, without rupture: Secondary | ICD-10-CM

## 2019-04-07 ENCOUNTER — Encounter: Payer: Self-pay | Admitting: Surgery

## 2019-04-07 NOTE — Progress Notes (Signed)
Cardiothoracic Surgery Consultation  PCP is Charleston Poot, MD Referring Provider is Charleston Poot, MD  Chief Complaint  Patient presents with  . Thoracic Aortic Aneurysm    Surgical eval, CTA Chest 03/09/2019-in PACs    HPI:  Patient is an 85 year old gentleman with history of diabetes, hypertension, hyperlipidemia, prostate cancer, and peripheral neuropathy who suffered a fall in January 2020 striking the left chest against the fireplace.  He had a CT scan of the chest without contrast at that time that showed no injury to the chest but showed an incidental 4.9 cm fusiform ascending aortic aneurysm.  There is no family history of aortic aneurysm, aortic dissection, or connective tissue disorder.  He subsequently had a follow-up CTA of the chest on 03/09/2019 which showed the ascending aortic aneurysm to measure 4.6 cm.  The aortic arch tapered to 3.4 cm and the descending thoracic aorta was 2.6 cm.  Past Medical History:  Diagnosis Date  . CAD (coronary artery disease)   . Chronic low back pain   . Diabetes mellitus without complication ([place])    type 2  . Diverticulosis of colon   . GERD (gastroesophageal reflux disease)   . Gout   . Gout   . H/O cardiovascular stress test 11/01/2009   NUCLEAR.....Marland KitchenNORMAL RESULTS  . Hx of therapeutic radiation    FOR PROSTATE  . Hyperlipemia   . Hypertension   . Malignant neoplasm of prostate ([place])   . Neuromuscular disorder ([place])    PERIPHERAL NEUROPATHY  . Neuropathy   . Personal history of antineoplastic chemotherapy   . Thoracic aortic aneurysm (TAA) ([place])   . Vitamin B12 deficiency     Past Surgical History:  Procedure Laterality Date  . APPENDECTOMY    . CARPEL TUNNEL Bilateral    R-06/01/18   L10/19  . COLONOSCOPY  04/25/2015   per Dr. Dorrene German.Marland KitchenExtensive Diverticulosis, BENIGN TUBULAR ADENOMA,   . CORONARY ANGIOPLASTY WITH STENT PLACEMENT    . FOOT SURGERY  2004   DR. LUCAS  . HERNIA REPAIR Right    INAUINAL  . INFECTED  FOOT ULCER  2007   DR. DIAL  . PROSTATE SURGERY  11/2008   TURP  . SPINE SURGERY     LAMINECTOMY X 2    Family History  Problem Relation Age of Onset  . Colon cancer Mother        Alzheimer's  . Pancreatic cancer Father   . CAD Sister        lung cancer  . Healthy Brother   . Healthy Daughter     Social History Social History   Tobacco Use  . Smoking status: Never Smoker  . Smokeless tobacco: Never Used  Substance Use Topics  . Alcohol use: No  . Drug use: No    Current Outpatient Medications  Medication Sig Dispense Refill  . allopurinol (ZYLOPRIM) 300 MG tablet Take 150 mg by mouth daily.    Marland Kitchen aspirin EC 81 MG tablet Take by mouth.    . clonazePAM (KLONOPIN) 0.5 MG tablet TAKE 2 TABLETS BY MOUTH NIGHTLY AS NEEDED FOR ANXIETY    . Cyanocobalamin (VITAMIN B12 PO) Take 5,000 mcg by mouth daily.    Marland Kitchen docusate sodium (COLACE) 100 MG capsule Take 100 mg by mouth 2 (two) times daily.    Marland Kitchen gabapentin (NEURONTIN) 300 MG capsule Take 600 mg by mouth 2 (two) times daily. TAKE 2 CAPSULES TO EQUAL 600 MG    . metFORMIN (GLUCOPHAGE) 500 MG tablet Take 500  mg by mouth 2 (two) times daily with a meal.    . omeprazole (PRILOSEC) 20 MG capsule Take 20 mg by mouth daily.    . rosuvastatin (CRESTOR) 40 MG tablet Take 40 mg by mouth daily.     . carbidopa-levodopa (SINEMET IR) 25-100 MG tablet      No current facility-administered medications for this visit.     Allergies  Allergen Reactions  . Tramadol Other (See Comments)    Makes me feel "crazy"  . Codeine     Other reaction(s): Diaphoresis / Sweating (intolerance)    Review of Systems  Constitutional: Negative.   HENT: Negative.   Eyes: Negative.   Respiratory: Negative.   Cardiovascular: Negative for chest pain.  Gastrointestinal: Negative.   Endocrine: Negative.   Genitourinary: Negative.   Musculoskeletal: Negative.   Skin: Negative.   Allergic/Immunologic: Negative.   Neurological:       Peripheral neuropathy   Hematological: Bruises/bleeds easily.  Psychiatric/Behavioral: Negative.     BP 120/69   Pulse 75   Temp 97.6 F (36.4 C) (Skin)   Resp 20   Ht 6\' 1"  (1.854 m)   Wt 206 lb (93.4 kg)   SpO2 95% Comment: RA  BMI 27.18 kg/m  Physical Exam Constitutional:      Appearance: Normal appearance. He is normal weight.  HENT:     Head: Normocephalic and atraumatic.     Mouth/Throat:     Mouth: Mucous membranes are moist.     Pharynx: Oropharynx is clear.  Eyes:     Extraocular Movements: Extraocular movements intact.     Pupils: Pupils are equal, round, and reactive to light.  Neck:     Musculoskeletal: Normal range of motion.     Vascular: No carotid bruit.  Cardiovascular:     Rate and Rhythm: Normal rate and regular rhythm.     Pulses: Normal pulses.     Heart sounds: Normal heart sounds. No murmur.  Pulmonary:     Effort: Pulmonary effort is normal.     Breath sounds: Normal breath sounds.  Abdominal:     General: Abdomen is flat.     Palpations: Abdomen is soft.     Tenderness: There is no abdominal tenderness.  Musculoskeletal:        General: No swelling.  Skin:    General: Skin is warm and dry.  Neurological:     General: No focal deficit present.     Mental Status: He is alert and oriented to person, place, and time.  Psychiatric:        Mood and Affect: Mood normal.        Behavior: Behavior normal.      Diagnostic Tests:  CTA of the chest from 03/09/2019 was reviewed on PACS and compared to the images of the CT scan of the chest without contrast on 10/24/2018.   Impression:  This 85 year old gentleman has a 4.6 cm fusiform ascending aortic aneurysm of unknown duration.  This was measured at 4.9 cm by CT scan in January 2020 but there was no contrast given for that CT scan which accounts for the larger measurement at that time since it is more difficult to see the edges of the aorta without contrast.  His aneurysm is well below the 5.5 cm surgical threshold.   I reviewed the images with him and answered all of his questions.  I stressed the importance of good blood pressure control and preventing further enlargement and acute aortic dissection.  I advised him against doing any heavy lifting of more than 25 pounds.  I have recommended repeating the CT scan in 1 year.   Plan:  He will return to see me in 1 year with a CTA of the chest.  I spent 30 minutes performing this consultation and > 50% of this time was spent face to face counseling and coordinating the care of this patient's ascending aortic aneurysm.   Gaye Pollack, MD [facility] [phone]

## 2020-03-15 ENCOUNTER — Other Ambulatory Visit: Payer: Self-pay | Admitting: Surgery

## 2020-03-15 DIAGNOSIS — I712 Thoracic aortic aneurysm, without rupture, unspecified: Secondary | ICD-10-CM

## 2020-04-25 ENCOUNTER — Ambulatory Visit (INDEPENDENT_AMBULATORY_CARE_PROVIDER_SITE_OTHER): Payer: MEDICARE | Admitting: Surgery

## 2020-04-25 ENCOUNTER — Ambulatory Visit
Admission: RE | Admit: 2020-04-25 | Discharge: 2020-04-25 | Disposition: A | Discharge status: Home / Self Care | Payer: MEDICARE | Source: Ambulatory Visit | Attending: Surgery | Admitting: Surgery

## 2020-04-25 ENCOUNTER — Other Ambulatory Visit: Payer: Self-pay

## 2020-04-25 ENCOUNTER — Encounter: Payer: Self-pay | Admitting: Surgery

## 2020-04-25 VITALS — BP 134/82 | HR 64 | Temp 96.6°F | Resp 16 | Ht 73.0 in | Wt 202.2 lb

## 2020-04-25 DIAGNOSIS — I712 Thoracic aortic aneurysm, without rupture, unspecified: Secondary | ICD-10-CM

## 2020-04-25 DIAGNOSIS — I7121 Aneurysm of the ascending aorta, without rupture: Secondary | ICD-10-CM

## 2020-04-25 MED ORDER — IOPAMIDOL (ISOVUE-370) INJECTION 76%
60.0000 mL | Freq: Once | INTRAVENOUS | Status: AC | PRN
  Administered 2020-04-25: 60 mL via INTRAVENOUS

## 2020-04-25 NOTE — Progress Notes (Signed)
HPI:  This 86 year old gentleman returns for follow-up of a 4.6 cm fusiform ascending aortic aneurysm.  Since I initially saw him in July 2020 he said he was diagnosed with prostate cancer that was treated with radiation therapy and hormone therapy.  He has had some problems with balance that he feels is due to peripheral neuropathy and is now using a cane.  He is here today with his wife.  He denies any chest pain or shortness of breath.  Current Outpatient Medications  Medication Sig Dispense Refill  . allopurinol (ZYLOPRIM) 300 MG tablet Take 150 mg by mouth daily.    Marland Kitchen aspirin EC 81 MG tablet Take by mouth.    . clonazePAM (KLONOPIN) 0.5 MG tablet at bedtime.     . Cyanocobalamin (VITAMIN B12 PO) Take 5,000 mcg by mouth daily.    Marland Kitchen gabapentin (NEURONTIN) 300 MG capsule Take 600 mg by mouth 2 (two) times daily. TAKE 2 CAPSULES TO EQUAL 600 MG    . metFORMIN (GLUCOPHAGE) 500 MG tablet Take 500 mg by mouth 2 (two) times daily with a meal.    . rosuvastatin (CRESTOR) 40 MG tablet Take 40 mg by mouth daily.     . carbidopa-levodopa (SINEMET IR) 25-100 MG tablet  (Patient not taking: Reported on 04/25/2020)    . omeprazole (PRILOSEC) 20 MG capsule Take 20 mg by mouth every other day.      No current facility-administered medications for this visit.     Physical Exam: BP (!) 134/82 (BP Location: Right Arm, Patient Position: Sitting, Cuff Size: Normal)   Pulse 64   Temp (!) 96.6 F (35.9 C)   Resp 16   Ht 6\' 1"  (1.854 m)   Wt 202 lb 3.2 oz (91.7 kg)   SpO2 96% Comment: RA  BMI 26.68 kg/m  He is an elderly gentleman in no distress.  He walks slowly with a cane. Cardiac exam shows a regular rate and rhythm with normal heart sounds.  There is no murmur. Lungs are clear.  Diagnostic Tests:  CLINICAL DATA:  Follow-up for thoracic aortic aneurysm.  EXAM: CT ANGIOGRAPHY CHEST WITH CONTRAST  TECHNIQUE: Multidetector CT imaging of the chest was performed using the standard  protocol during bolus administration of intravenous contrast. Multiplanar CT image reconstructions and MIPs were obtained to evaluate the vascular anatomy.  CONTRAST:  87mL ISOVUE-370 IOPAMIDOL (ISOVUE-370) INJECTION 76%  COMPARISON:  03/09/2019, 10/24/2018.  FINDINGS: Cardiovascular: Ascending thoracic aorta is dilated to 4.6 cm, without change. There is no aortic dissection. Mild atherosclerotic disease is noted along the arch and descending portions with minor plaque at the origins of the arch branch vessels. There is no significant stenosis.  The heart is normal in size. No pericardial effusion. Three-vessel coronary artery calcifications. Pulmonary arteries are normal in caliber.  Mediastinum/Nodes: No neck base, axillary, mediastinal or hilar masses or enlarged lymph nodes. Trachea and esophagus are unremarkable.  Lungs/Pleura: Lungs essentially clear. No pleural effusion or pneumothorax.  Upper Abdomen: Unremarkable.  Musculoskeletal: No fracture or acute finding. No osteoblastic or osteolytic lesions.  Review of the MIP images confirms the above findings.  IMPRESSION: 1. Ascending thoracic aortic aneurysm measuring 4.6 cm, without change from the exam dated 03/09/2019. Mild atherosclerosis. No dissection. Ascending thoracic aortic aneurysm. Recommend semi-annual imaging followup by CTA or MRA and referral to cardiothoracic surgery if not already obtained. This recommendation follows 2010 ACCF/AHA/AATS/ACR/ASA/SCA/SCAI/SIR/STS/SVM Guidelines for the Diagnosis and Management of Patients With Thoracic Aortic Disease. Circulation. 2010; 121: J628-Z662.  Aortic aneurysm NOS (ICD10-I71.9) 2. No acute findings. 3. Three-vessel coronary artery calcifications.  Aortic aneurysm NOS (ICD10-I71.9).   Electronically Signed   By: Lajean Manes M.D.   On: 04/25/2020 09:48   Impression:  He has a stable 4.6 cm fusiform ascending aortic aneurysm which is  not changed significantly compared to his prior exam on 03/09/2019.  This is still well below the surgical threshold of 5.5 cm.  I reviewed the CTA images with the patient and his wife and answered their questions.  I stressed the importance of continued good blood pressure control and preventing further enlargement and acute aortic dissection.  Given the size of this aneurysm and his age I think is unlikely that he would have significant enlargement to warrant consideration of surgery.  Therefore I think it is reasonable to wait 2 years to follow-up on his aneurysm.  I told them that if he has significant decline in his functional status over the next 2 years that it would be reasonable to cancel that CT scan.  They are in agreement with that.  Plan:  We will tentatively plan to repeat his CTA in 2 years unless he has a significant decline in his functional status.  I spent 20 minutes performing this established patient evaluation and > 50% of this time was spent face to face counseling and coordinating the care of this patient's aortic aneurysm.    Gaye Pollack, MD [facility] [phone]

## 2020-11-16 IMAGING — CT CT ANGIO CHEST
2 of 6 series · 13 of 36 positions shown · IV contrast (iopamidol)
Comparison: 03/09/2019, 10/24/2018.

CLINICAL DATA: Follow-up for thoracic aortic aneurysm.

EXAM:
CT ANGIOGRAPHY CHEST WITH CONTRAST
TECHNIQUE: Multidetector CT imaging of the chest was performed using the
standard protocol during bolus administration of intravenous
contrast. Multiplanar CT image reconstructions and MIPs were
obtained to evaluate the vascular anatomy.
CONTRAST:  60mL [id number] IOPAMIDOL ([id number]) INJECTION 76%

[Series 5: cta thorax 2.00 bv36 s3 axial arterial · axial · arterial · 0.79mm/px · z∈[+1496,+1740]mm · 12 of 146 slices shown]
[im 12/146  lung]
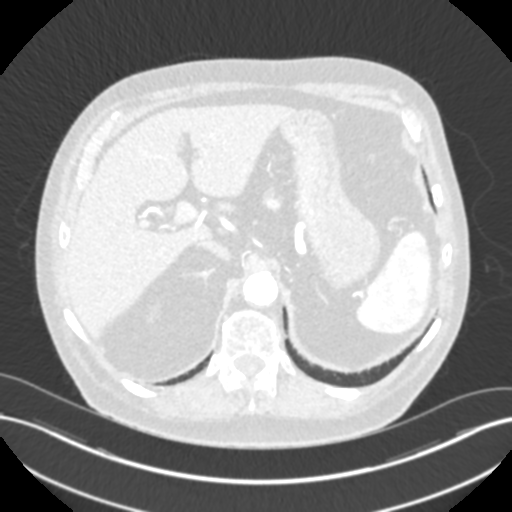
[im 23/146  mediastinal]
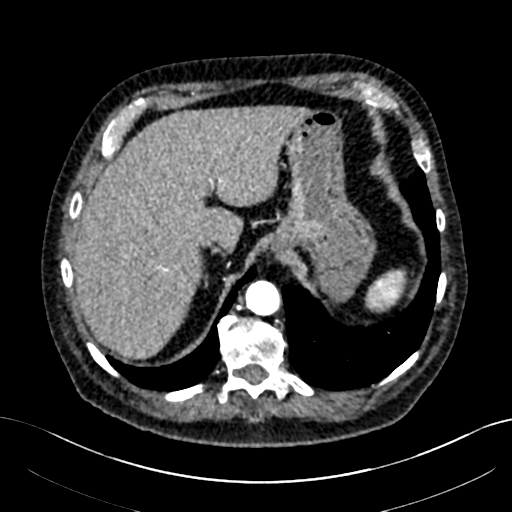
[im 34/146  lung]
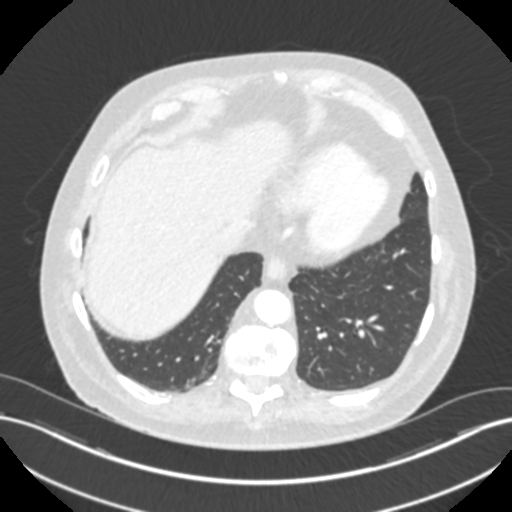
[im 45/146  mediastinal]
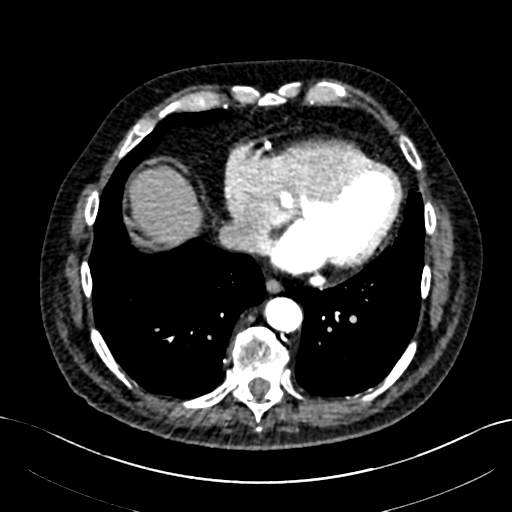
[im 56/146  lung]
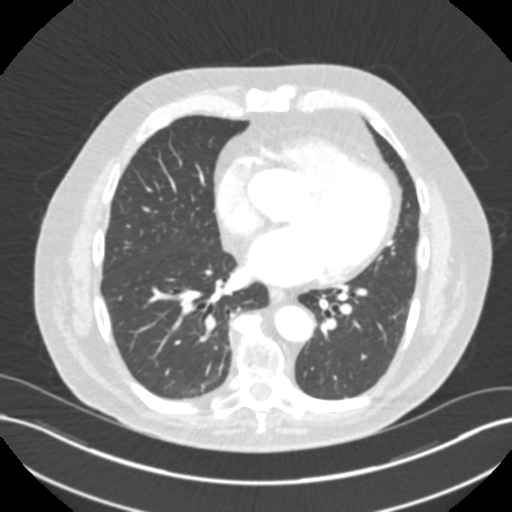
[im 67/146  mediastinal]
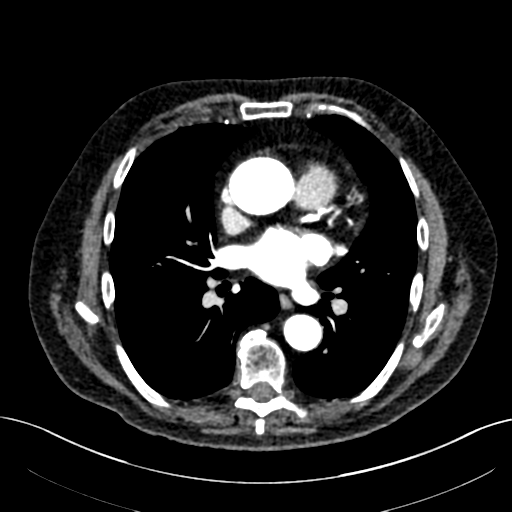
[im 79/146  lung]
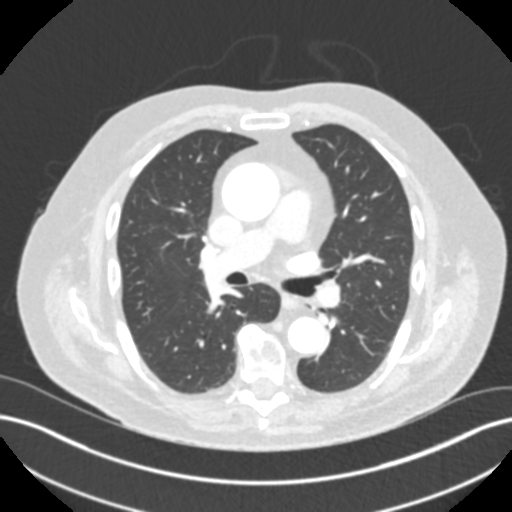
[im 90/146  mediastinal]
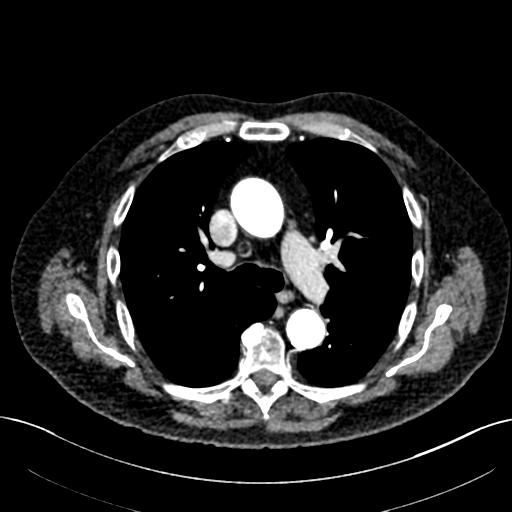
[im 101/146  lung]
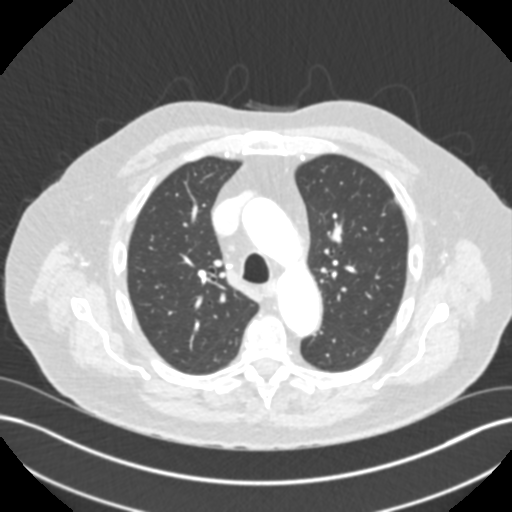
[im 112/146  mediastinal]
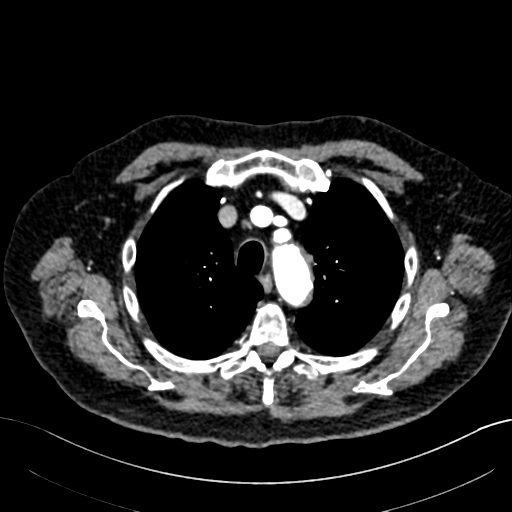
[im 123/146  lung]
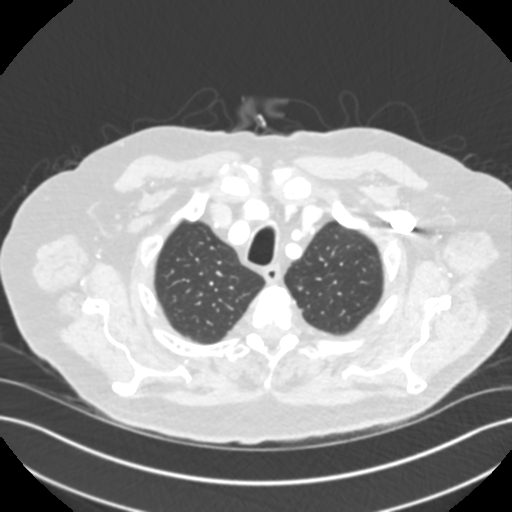
[im 134/146  mediastinal]
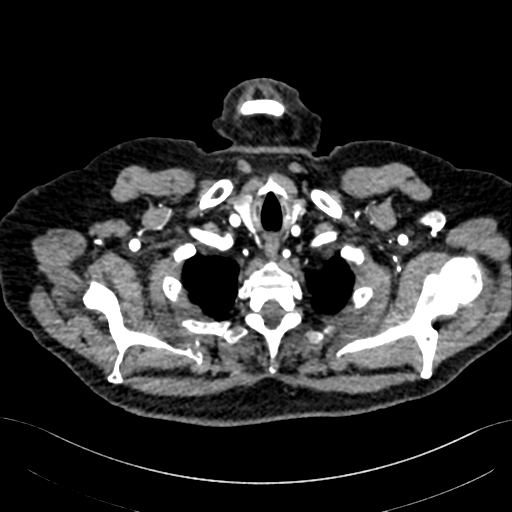

[Series 10: cta thorax 2.00 bv36 s3 cor st · coronal · 0.57mm/px · 1 of 201 slices shown]
[im 101/201  mediastinal]
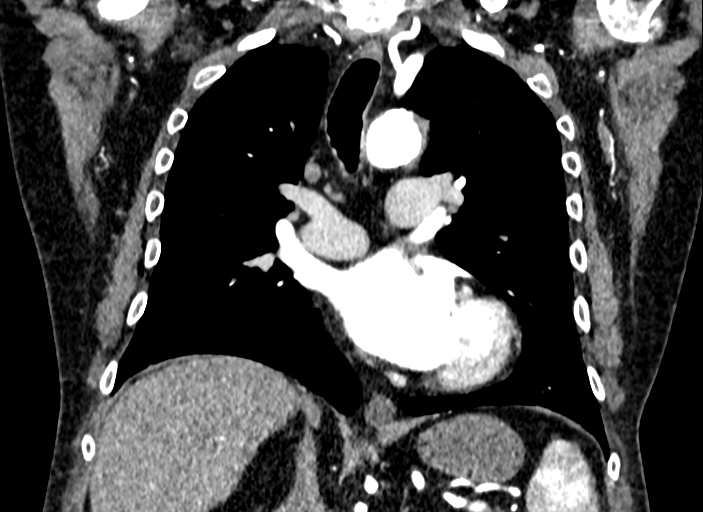

[13 of 36 positions shown; findings below may reference images not displayed]

FINDINGS: Cardiovascular: Ascending thoracic aorta is dilated to 4.6 cm,
without change. There is no aortic dissection. Mild atherosclerotic
disease is noted along the arch and descending portions with minor
plaque at the origins of the arch branch vessels. There is no
significant stenosis.

The heart is normal in size. No pericardial effusion. Three-vessel
coronary artery calcifications. Pulmonary arteries are normal in
caliber.

Mediastinum/Nodes: No neck base, axillary, mediastinal or hilar
masses or enlarged lymph nodes. Trachea and esophagus are
unremarkable.

Lungs/Pleura: Lungs essentially clear. No pleural effusion or
pneumothorax.

Upper Abdomen: Unremarkable.

Musculoskeletal: No fracture or acute finding. No osteoblastic or
osteolytic lesions.

Review of the MIP images confirms the above findings.
IMPRESSION: 1. Ascending thoracic aortic aneurysm measuring 4.6 cm, without
change from the exam dated 03/09/2019. Mild atherosclerosis. No
dissection. Ascending thoracic aortic aneurysm. Recommend
semi-annual imaging followup by CTA or MRA and referral to
cardiothoracic surgery if not already obtained. This recommendation
follows 6434 ACCF/AHA/AATS/ACR/ASA/SCA/BOMBEO/RVERA/ACHILOV/SAMAKABADI Guidelines
for the Diagnosis and Management of Patients With Thoracic Aortic
Disease. Circulation. 6434; 121: E266-e369. Aortic aneurysm NOS
(BH2UE-PG8.P)
2. No acute findings.
3. Three-vessel coronary artery calcifications.

Aortic aneurysm NOS (BH2UE-PG8.P).

## 2022-03-03 ENCOUNTER — Other Ambulatory Visit: Payer: Self-pay | Admitting: *Deleted

## 2022-03-03 DIAGNOSIS — I7121 Aneurysm of the ascending aorta, without rupture: Secondary | ICD-10-CM

## 2022-04-30 ENCOUNTER — Inpatient Hospital Stay: Admission: RE | Admit: 2022-04-30 | Payer: MEDICARE | Source: Ambulatory Visit

## 2022-04-30 ENCOUNTER — Other Ambulatory Visit: Payer: MEDICARE

## 2022-04-30 ENCOUNTER — Encounter: Payer: MEDICARE | Admitting: Surgery

## 2024-08-29 DEATH — deceased
